# Patient Record
Sex: Female | Born: 1955 | Race: White | Hispanic: No | Marital: Single | Smoking: Never smoker
Health system: Southern US, Community
[De-identification: ages and names within clinical notes are randomized; demographics above are authoritative.]

---

## 2007-12-24 ENCOUNTER — Emergency Department: Payer: Self-pay | Admitting: Emergency Medicine

## 2007-12-24 ENCOUNTER — Other Ambulatory Visit: Payer: Self-pay

## 2007-12-27 ENCOUNTER — Inpatient Hospital Stay: Payer: Self-pay | Admitting: Internal Medicine

## 2008-05-18 IMAGING — CT CT HEAD WITHOUT CONTRAST
2 series · 16 of 30 positions shown, 20 images · non-contrast
Comparison: none

REASON FOR EXAM: Presyncopal event
COMMENTS:   LMP: Post-Menopausal

[Series 2: without · axial · non-contrast · 0.45mm/px · z∈[+1196,+1321]mm · 13 of 31 slices shown, 17 images]
[im 3/31  brain]
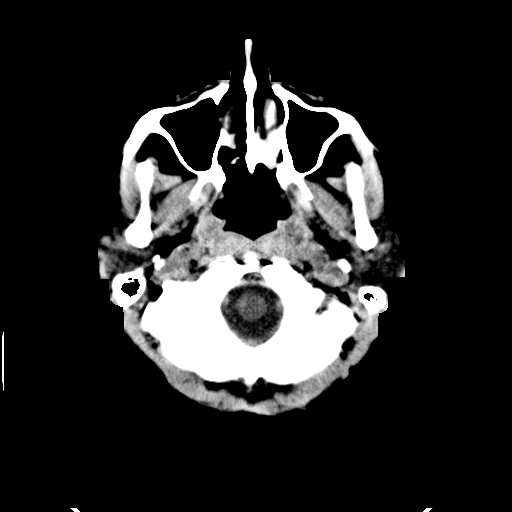
[im 3/31  bone]
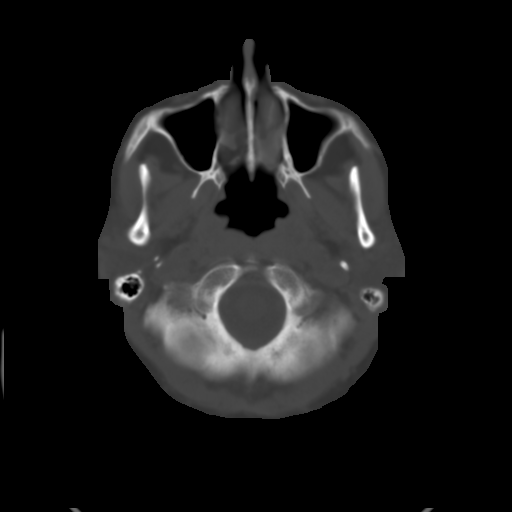
[im 5/31  brain]
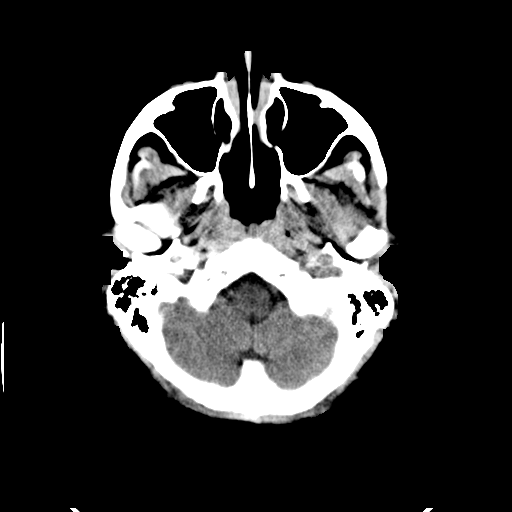
[im 7/31  brain]
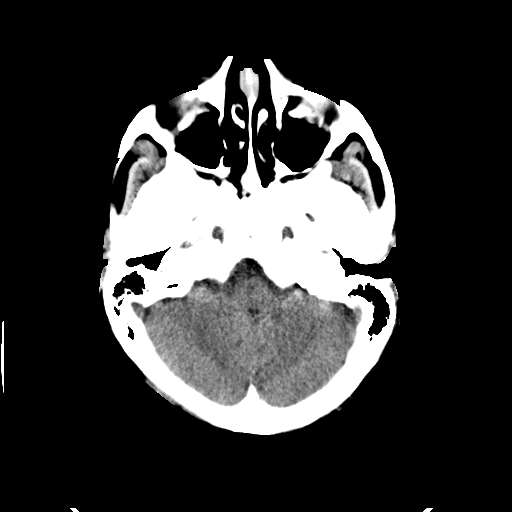
[im 9/31  brain]
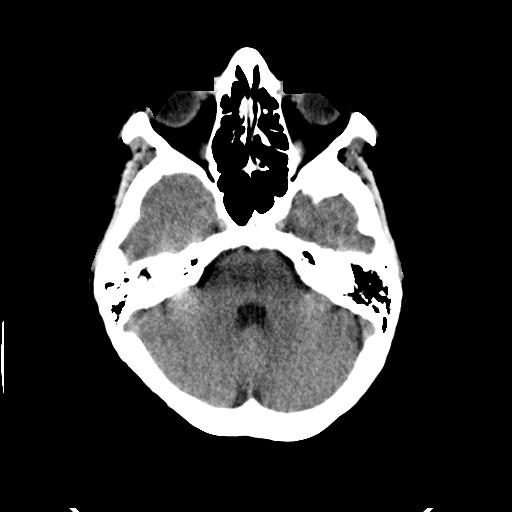
[im 11/31  brain]
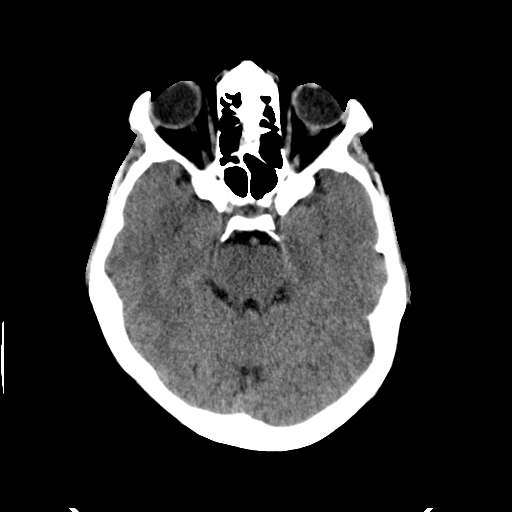
[im 11/31  bone]
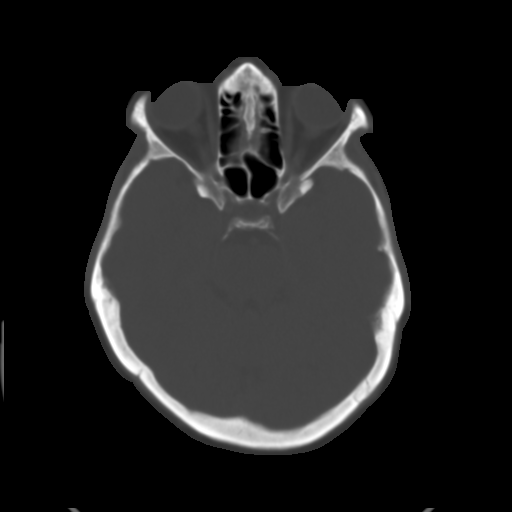
[im 13/31  brain]
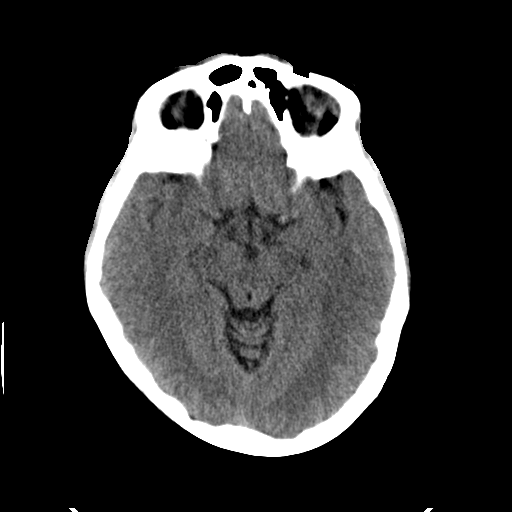
[im 16/31  brain]
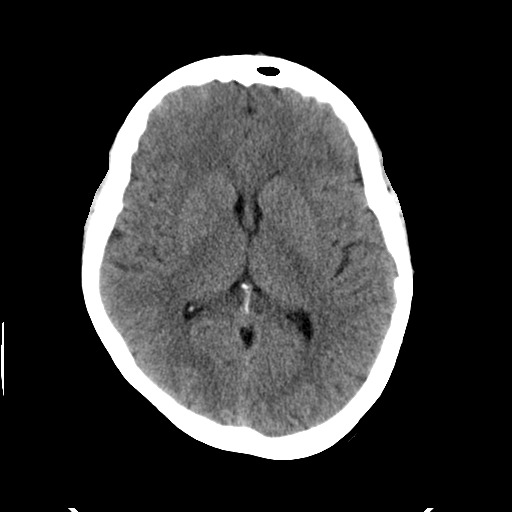
[im 18/31  brain]
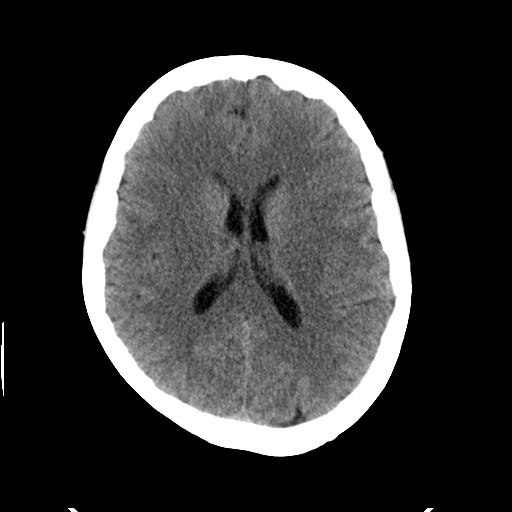
[im 20/31  brain]
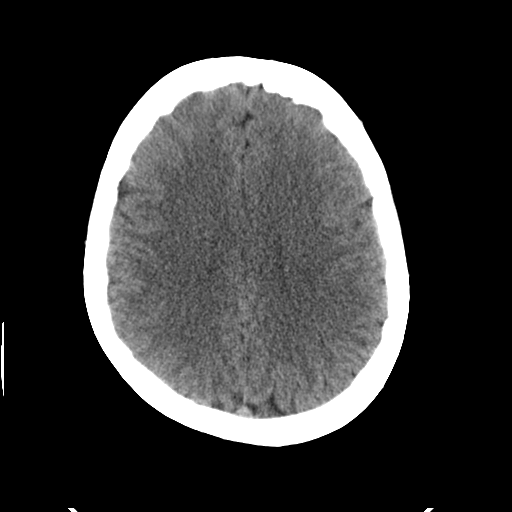
[im 20/31  bone]
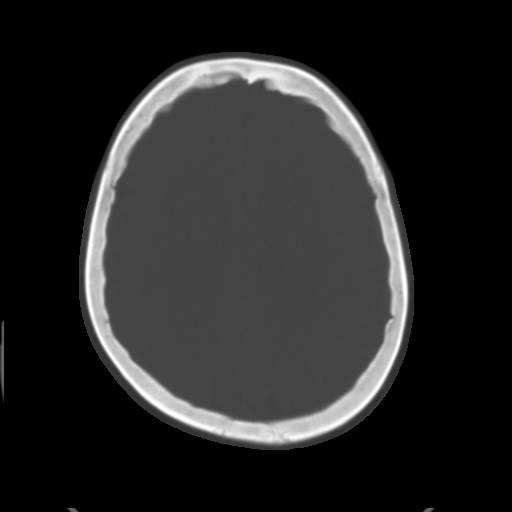
[im 22/31  brain]
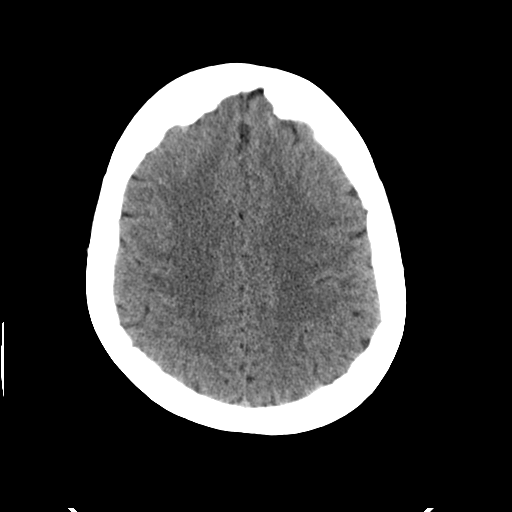
[im 24/31  brain]
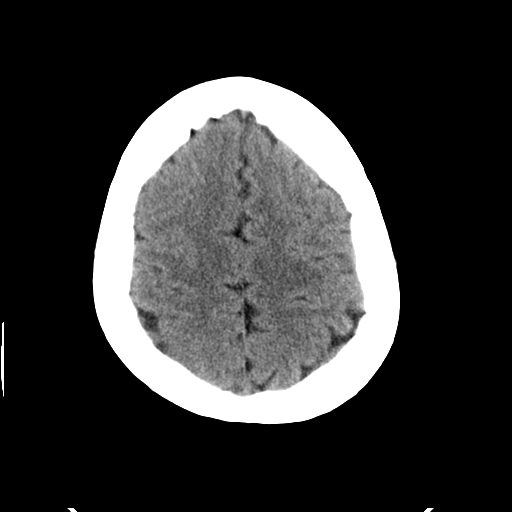
[im 26/31  brain]
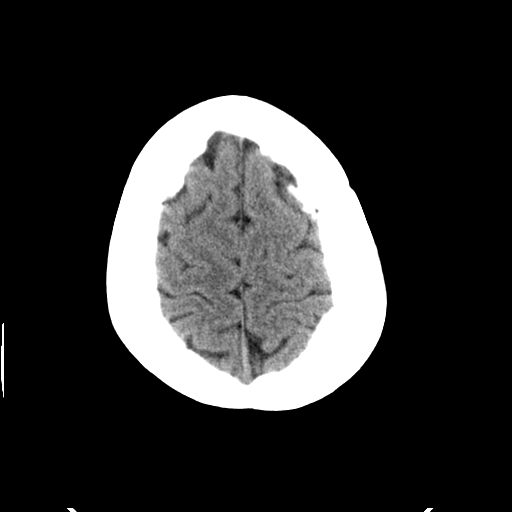
[im 28/31  brain]
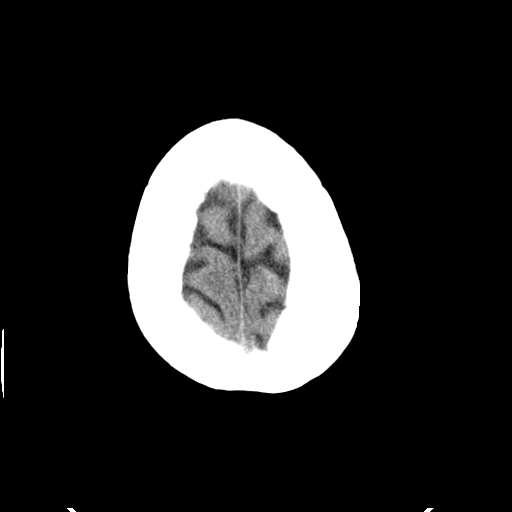
[im 28/31  bone]
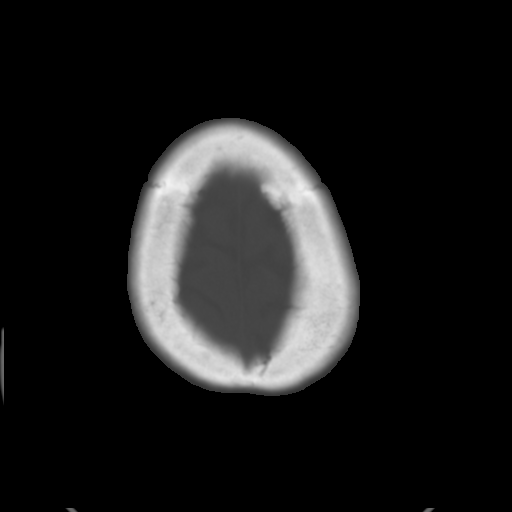

[Series 3: bone · axial · 0.45mm/px · z∈[+1196,+1236]mm · 3 of 31 slices shown]
[im 3/31  bone]
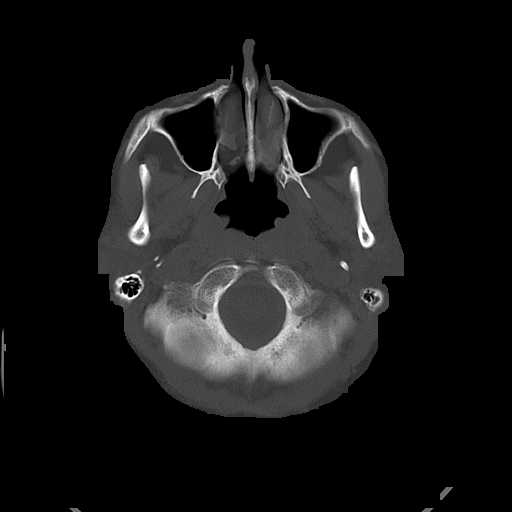
[im 7/31  bone]
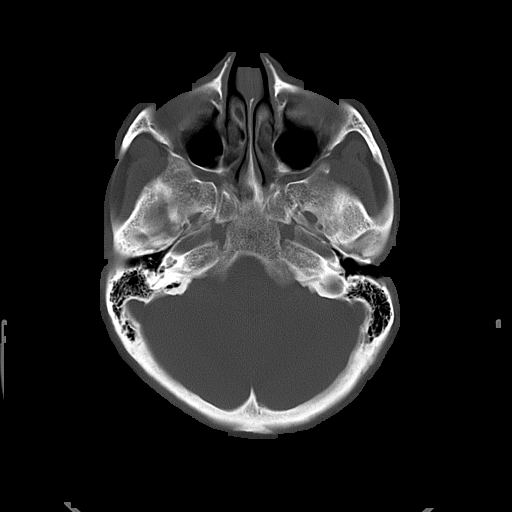
[im 11/31  bone]
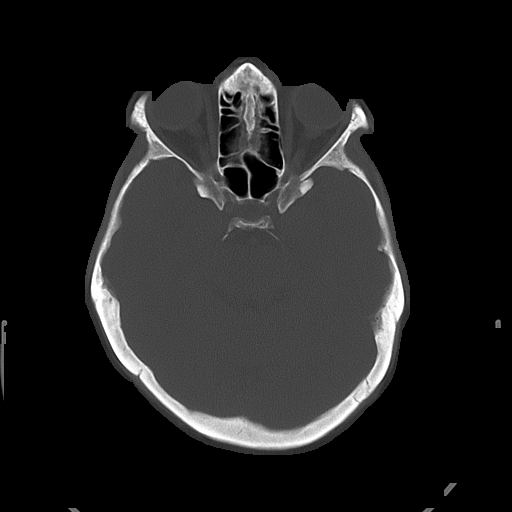

[16 of 30 positions shown; findings below may reference images not displayed]

PROCEDURE:     CT  - CT HEAD WITHOUT CONTRAST  - December 25, 2007 [DATE]

RESULT:     There is no evidence of intra-axial or extra-axial fluid
collections. There is no evidence of acute hemorrhage or secondary signs
reflecting mass effect or subacute or chronic infarction. The visualized
bony skeleton is evaluated and there is no evidence of depressed skull
fracture or further fracture or dislocation. The visualized mastoid air
cells are clear. The ventricles, cisterns and sulci are symmetric and
patent.
IMPRESSION: 1.     No evidence of acute intracranial abnormalities as described above.
2.     If there is persistent clinical concern, further evaluation with
brain MRI is recommended, if clinically warranted.
3.     Preliminary faxed report was relayed to Dr. Mignonne of the Emergency
Department on 12/24/2007 at [DATE], Central Time.

## 2009-06-20 ENCOUNTER — Ambulatory Visit: Payer: Self-pay | Admitting: Cardiology

## 2009-08-28 ENCOUNTER — Emergency Department: Payer: Self-pay | Admitting: Emergency Medicine

## 2009-08-29 ENCOUNTER — Ambulatory Visit: Payer: Self-pay | Admitting: Unknown Physician Specialty

## 2015-11-14 ENCOUNTER — Ambulatory Visit: Payer: Self-pay | Attending: Oncology | Admitting: *Deleted

## 2015-11-14 ENCOUNTER — Ambulatory Visit
Admission: RE | Admit: 2015-11-14 | Discharge: 2015-11-14 | Disposition: A | Discharge status: Home / Self Care | Payer: Self-pay | Source: Ambulatory Visit | Attending: Oncology | Admitting: Oncology

## 2015-11-14 ENCOUNTER — Encounter: Payer: Self-pay | Admitting: *Deleted

## 2015-11-14 VITALS — BP 137/82 | HR 74 | Temp 98.8°F | Ht 62.6 in | Wt 201.2 lb

## 2015-11-14 DIAGNOSIS — Z0000 Encounter for general adult medical examination without abnormal findings: Secondary | ICD-10-CM

## 2015-11-14 NOTE — Progress Notes (Signed)
Subjective:     Patient ID: Catherine Salinas, female   DOB: 10-19-1956, 59 y.o.   MRN: [id number]  HPI   Review of Systems     Objective:   Physical Exam  Pulmonary/Chest: Right breast exhibits no inverted nipple, no mass, no nipple discharge, no skin change and no tenderness. Left breast exhibits no inverted nipple, no mass, no nipple discharge and no skin change. Breasts are symmetrical.  Large pendulous breast  Abdominal: There is no splenomegaly or hepatomegaly.  Genitourinary: Right adnexum displays no mass, no tenderness and no fullness. Left adnexum displays no mass, no tenderness and no fullness. No erythema, tenderness or bleeding in the vagina. No foreign body around the vagina. No signs of injury around the vagina. No vaginal discharge found.  History of hysterectomy for fibroids in 1999.  No cervix noted.  Patient states she has her ovaries.       Assessment:     59 year old White female presents to [facility] for clinical breast exam, baseline pap and mammogram.  Clinical breast exam unremarkable.  Taught self breast awareness.  Specimen collected for baseline pap.  No cervix noted, consistent with history of hysterectomy.  Patient has been screened for eligibility.  She does not have any insurance, Medicare or Medicaid.  She also meets financial eligibility.  Hand-out given on the Affordable Care Act.     Plan:     Screening mammogram ordered.  Specimen sent to the lab.  Will follow-up per [facility] protocol.

## 2015-11-14 NOTE — Patient Instructions (Signed)
Gave patient hand-out, Women Staying Healthy, Active and Well from [facility], with education on breast health, pap smears, heart and colon health. 

## 2015-11-22 LAB — PAP LB AND HPV HIGH-RISK
HPV, high-risk: NEGATIVE
PAP Smear Comment: 0

## 2015-11-29 ENCOUNTER — Encounter: Payer: Self-pay | Admitting: *Deleted

## 2015-11-29 NOTE — Progress Notes (Signed)
Letter mailed to inform patient of her normal mammogram and pap smear.  To follow up in one year with her next mammogram and 5 years for her next pap smear.  [facility] [place].

## 2021-05-01 ENCOUNTER — Other Ambulatory Visit: Payer: Self-pay | Admitting: Family Medicine

## 2021-05-01 DIAGNOSIS — Z1231 Encounter for screening mammogram for malignant neoplasm of breast: Secondary | ICD-10-CM

## 2021-05-23 ENCOUNTER — Other Ambulatory Visit: Payer: Self-pay

## 2021-05-23 ENCOUNTER — Ambulatory Visit (LOCAL_COMMUNITY_HEALTH_CENTER): Payer: Medicare Other

## 2021-05-23 DIAGNOSIS — Z23 Encounter for immunization: Secondary | ICD-10-CM

## 2021-05-23 NOTE — Progress Notes (Signed)
Pt to clinic requesting influenza vaccine. Pt states she has not received a tetanus vaccine within the last 10 years; accepts Tdap today. Pt to schedule immunization appt in approx 2 weeks for additional vaccine to address pneumonia.

## 2021-06-06 ENCOUNTER — Other Ambulatory Visit: Payer: Self-pay

## 2021-06-06 ENCOUNTER — Ambulatory Visit (LOCAL_COMMUNITY_HEALTH_CENTER): Payer: Medicare Other

## 2021-06-06 DIAGNOSIS — Z7185 Encounter for immunization safety counseling: Secondary | ICD-10-CM

## 2021-06-06 NOTE — Progress Notes (Signed)
In Nurse Clinic requesting pneumonia vaccine. States she had a pneumonia vaccine when she had pneumonia which was greater than 10 yrs ago, but has no documentation with her. [facility] and Epic do not show any record of pneumonia vaccine. RN counseled pt that state is recommending newest pneumonia vaccine: Prevnar 20 which will be arriving at [facility] next week. Clerk to schedule immunization appt for Prevnar 20 in 1-2 weeks. Pt in agreement. Questions answered and reports understanding.Jerel Shepherd, RN

## 2021-06-20 ENCOUNTER — Ambulatory Visit: Payer: Medicare Other

## 2021-06-25 ENCOUNTER — Ambulatory Visit (LOCAL_COMMUNITY_HEALTH_CENTER): Payer: Medicare Other

## 2021-06-25 ENCOUNTER — Other Ambulatory Visit: Payer: Self-pay

## 2021-06-25 DIAGNOSIS — Z23 Encounter for immunization: Secondary | ICD-10-CM | POA: Diagnosis not present

## 2021-06-25 NOTE — Progress Notes (Signed)
In Nurse Clinic for prevnar 20 vaccine. Tolerated well. [facility] updated and copy given. Jerel Shepherd, RN

## 2023-10-05 ENCOUNTER — Other Ambulatory Visit: Payer: Self-pay | Admitting: Family Medicine

## 2023-10-05 ENCOUNTER — Ambulatory Visit: Payer: Medicare Other

## 2023-10-05 DIAGNOSIS — Z23 Encounter for immunization: Secondary | ICD-10-CM | POA: Diagnosis not present

## 2023-10-05 DIAGNOSIS — Z1231 Encounter for screening mammogram for malignant neoplasm of breast: Secondary | ICD-10-CM

## 2023-10-05 DIAGNOSIS — Z719 Counseling, unspecified: Secondary | ICD-10-CM

## 2023-10-05 NOTE — Progress Notes (Signed)
Patient seen in nurse clinic for vaccinations.  COVID - only vaccine needed at this time.  Moderna +12Y [id number] IM right deltoid. Tolerated well. [facility] updated and copy provided. VIS provided.
